# Patient Record
Sex: Male | Born: 1995 | Race: Black or African American | Hispanic: No | Marital: Single | Smoking: Never smoker
Health system: Southern US, Community
[De-identification: ages and names within clinical notes are randomized; demographics above are authoritative.]

## PROBLEM LIST (undated history)

## (undated) HISTORY — PX: APPENDECTOMY: SHX54

---

## 2020-03-23 ENCOUNTER — Emergency Department: Payer: Self-pay

## 2020-03-23 ENCOUNTER — Emergency Department
Admission: EM | Admit: 2020-03-23 | Discharge: 2020-03-23 | Disposition: A | Discharge status: Home / Self Care | Payer: Self-pay | Attending: Emergency Medicine | Admitting: Emergency Medicine

## 2020-03-23 ENCOUNTER — Other Ambulatory Visit: Payer: Self-pay

## 2020-03-23 DIAGNOSIS — Y999 Unspecified external cause status: Secondary | ICD-10-CM | POA: Insufficient documentation

## 2020-03-23 DIAGNOSIS — Y9289 Other specified places as the place of occurrence of the external cause: Secondary | ICD-10-CM | POA: Insufficient documentation

## 2020-03-23 DIAGNOSIS — Y9345 Activity, cheerleading: Secondary | ICD-10-CM | POA: Insufficient documentation

## 2020-03-23 DIAGNOSIS — W098XXA Fall on or from other playground equipment, initial encounter: Secondary | ICD-10-CM | POA: Insufficient documentation

## 2020-03-23 DIAGNOSIS — S83095A Other dislocation of left patella, initial encounter: Secondary | ICD-10-CM | POA: Insufficient documentation

## 2020-03-23 DIAGNOSIS — M7989 Other specified soft tissue disorders: Secondary | ICD-10-CM

## 2020-03-23 DIAGNOSIS — S83005A Unspecified dislocation of left patella, initial encounter: Secondary | ICD-10-CM

## 2020-03-23 MED ORDER — MORPHINE SULFATE (PF) 4 MG/ML IV SOLN
4.0000 mg | Freq: Once | INTRAVENOUS | Status: AC
  Administered 2020-03-23: 4 mg via INTRAVENOUS
  Filled 2020-03-23: qty 1

## 2020-03-23 MED ORDER — ONDANSETRON HCL 4 MG/2ML IJ SOLN
4.0000 mg | Freq: Once | INTRAMUSCULAR | Status: AC
  Administered 2020-03-23: 4 mg via INTRAVENOUS
  Filled 2020-03-23: qty 2

## 2020-03-23 MED ORDER — HYDROMORPHONE HCL 1 MG/ML IJ SOLN
1.0000 mg | Freq: Once | INTRAMUSCULAR | Status: AC
  Administered 2020-03-23: 1 mg via INTRAVENOUS
  Filled 2020-03-23: qty 1

## 2020-03-23 NOTE — ED Provider Notes (Signed)
[facility] Emergency Department Provider Note ____________________________________________   First MD Initiated Contact with Patient 03/23/20 1745     (approximate)  I have reviewed the triage vital signs and the nursing notes.   HISTORY  Chief Complaint Left Knee Dislocation    HPI Kyle Carey is a 24 y.o. male with PMH as noted below who presents with left knee injury, acute onset when the patient was tumbling during a cheerleading maneuver.  The patient had acute pain and was unable to walk.  He reports a deformity to the left knee.  He denies any prior history of knee patellar dislocations.  He denies any other injuries.  History reviewed. No pertinent past medical history.  There are no problems to display for this patient.   Past Surgical History:  Procedure Laterality Date  . APPENDECTOMY      Prior to Admission medications   Not on File    Allergies Contrast media [iodinated diagnostic agents]  No family history on file.  Social History Social History   Tobacco Use  . Smoking status: Never Smoker  . Smokeless tobacco: Never Used  Vaping Use  . Vaping Use: Never used  Substance Use Topics  . Alcohol use: Yes    Comment: Occasional / Social drinker  . Drug use: Yes    Types: Marijuana    Comment: Once a week    Review of Systems  Constitutional: No fever. Eyes: No redness. ENT: No sore throat. Cardiovascular: Denies chest pain. Respiratory: Denies shortness of breath. Gastrointestinal: No vomiting. Genitourinary: Negative for flank pain. Musculoskeletal: Negative for back pain.  Positive for left knee injury. Skin: Negative for rash. Neurological: Negative for focal weakness or numbness.   ____________________________________________   PHYSICAL EXAM:  VITAL SIGNS: ED Triage Vitals  Enc Vitals Group     BP 03/23/20 1739 127/74     Pulse Rate 03/23/20 1800 80     Resp 03/23/20 1800 20     Temp 03/23/20  1800 98.1 F (36.7 C)     Temp Source 03/23/20 1800 Oral     SpO2 03/23/20 1800 98 %     Weight 03/23/20 1751 233 lb 0.4 oz (105.7 kg)     Height 03/23/20 1751 5\' 10"  (1.778 m)     Head Circumference --      Peak Flow --      Pain Score 03/23/20 1751 7     Pain Loc --      Pain Edu? --      Excl. in GC? --     Constitutional: Alert and oriented.  Uncomfortable appearing but in no acute distress. Eyes: Conjunctivae are normal.  Head: Atraumatic. Nose: No congestion/rhinnorhea. Mouth/Throat: Mucous membranes are moist.   Neck: Normal range of motion.  Cardiovascular: Normal rate, regular rhythm.   Good peripheral circulation. Respiratory: Normal respiratory effort.  No retractions.  Gastrointestinal: No distention.  Musculoskeletal: No lower extremity edema.  Extremities warm and well perfused.  Left knee deformity consistent with patellar dislocation.  2+ DP pulse.  Full range of motion at the ankle and foot. Neurologic:  Normal speech and language.  5/5 motor strength and intact sensation to the distal left leg. Skin:  Skin is warm and dry. No rash noted. Psychiatric: Mood and affect are normal. Speech and behavior are normal.  ____________________________________________   LABS (all labs ordered are listed, but only abnormal results are displayed)  Labs Reviewed - No data to display ____________________________________________  EKG  ____________________________________________  RADIOLOGY  XR L knee: Lateral patellar dislocation XR L knee post reduction: Normal anatomic alignment of the patella ____________________________________________   PROCEDURES  Procedure(s) performed: Yes  .Ortho Injury Treatment  Date/Time: 03/23/2020 8:24 PM Performed by: Dionne Bucy, MD Authorized by: Dionne Bucy, MD   Consent:    Consent obtained:  Verbal   Consent given by:  Patient   Risks discussed:  Recurrent dislocation and stiffnessInjury location:  knee Location details: left knee Injury type: dislocation Dislocation type: lateral patellar Pre-procedure neurovascular assessment: neurovascularly intact  Anesthesia: Local anesthesia used: no  Patient sedated: NoManipulation performed: yes Reduction method: direct traction Reduction successful: yes X-ray confirmed reduction: yes Immobilization: brace Post-procedure neurovascular assessment: post-procedure neurovascularly intact Patient tolerance: patient tolerated the procedure well with no immediate complications     Critical Care performed: No ____________________________________________   INITIAL IMPRESSION / ASSESSMENT AND PLAN / ED COURSE  Pertinent labs & imaging results that were available during my care of the patient were reviewed by me and considered in my medical decision making (see chart for details).  24 year old male with no active medical problems presents with a left knee injury consistent with a patellar dislocation.  The left lower extremity is neuro/vascular intact on exam.  He has no other injuries.  He has no prior history of patellar dislocation.  We will give analgesia, obtain an x-ray to confirm, and then perform a reduction.  ----------------------------------------- 8:26 PM on 03/23/2020 -----------------------------------------  X-ray confirmed a lateral patellar dislocation.  The patient had good pain control with Dilaudid, and I was able to reduce the dislocation manually without sedation.  He tolerated the procedure well.  Postreduction x-ray confirmed good anatomic alignment.  He was placed in a knee immobilizer.  He is stable for discharge home at this time.  I instructed him on use of the knee immobilizer, orthopedic follow-up, and return precautions; he expressed understanding.  ____________________________________________   FINAL CLINICAL IMPRESSION(S) / ED DIAGNOSES  Final diagnoses:  Dislocation of left patella, initial encounter       NEW MEDICATIONS STARTED DURING THIS VISIT:  New Prescriptions   No medications on file     Note:  This document was prepared using Dragon voice recognition software and may include unintentional dictation errors.    Dionne Bucy, MD 03/23/20 2026

## 2020-03-23 NOTE — Discharge Instructions (Addendum)
Keep the knee immobilizer (the large brace) on for the next several days until the swelling and pain go down and the knee is feeling back to normal.  Follow-up with the orthopedist within the next 1 to 2 weeks.  Do not resume strenuous physical activity until you are seen by the orthopedist.  Return to the ER for new, worsening, or persistent severe pain, recurrent dislocation, weakness or numbness, or any other new or worsening symptoms that concern you.

## 2020-03-23 NOTE — ED Triage Notes (Signed)
Patient arrived via EMS. Patient is from home, AOx[facility] and ambulatory at baseline although not currently. Patient was at cheer practice and dislocated left knee. Patient was given fentanyl in route by EMS.   Patient has no other injuries or complaints at this time.

## 2022-02-28 IMAGING — DX DG KNEE 1-2V PORT*L*
2 series · 2 of 2 positions shown · non-contrast
Comparison: 03/23/2020 [DATE] p.m.

CLINICAL DATA: Patellar dislocation status post reduction

EXAM:
PORTABLE LEFT KNEE - 1-2 VIEW

[knee ap]
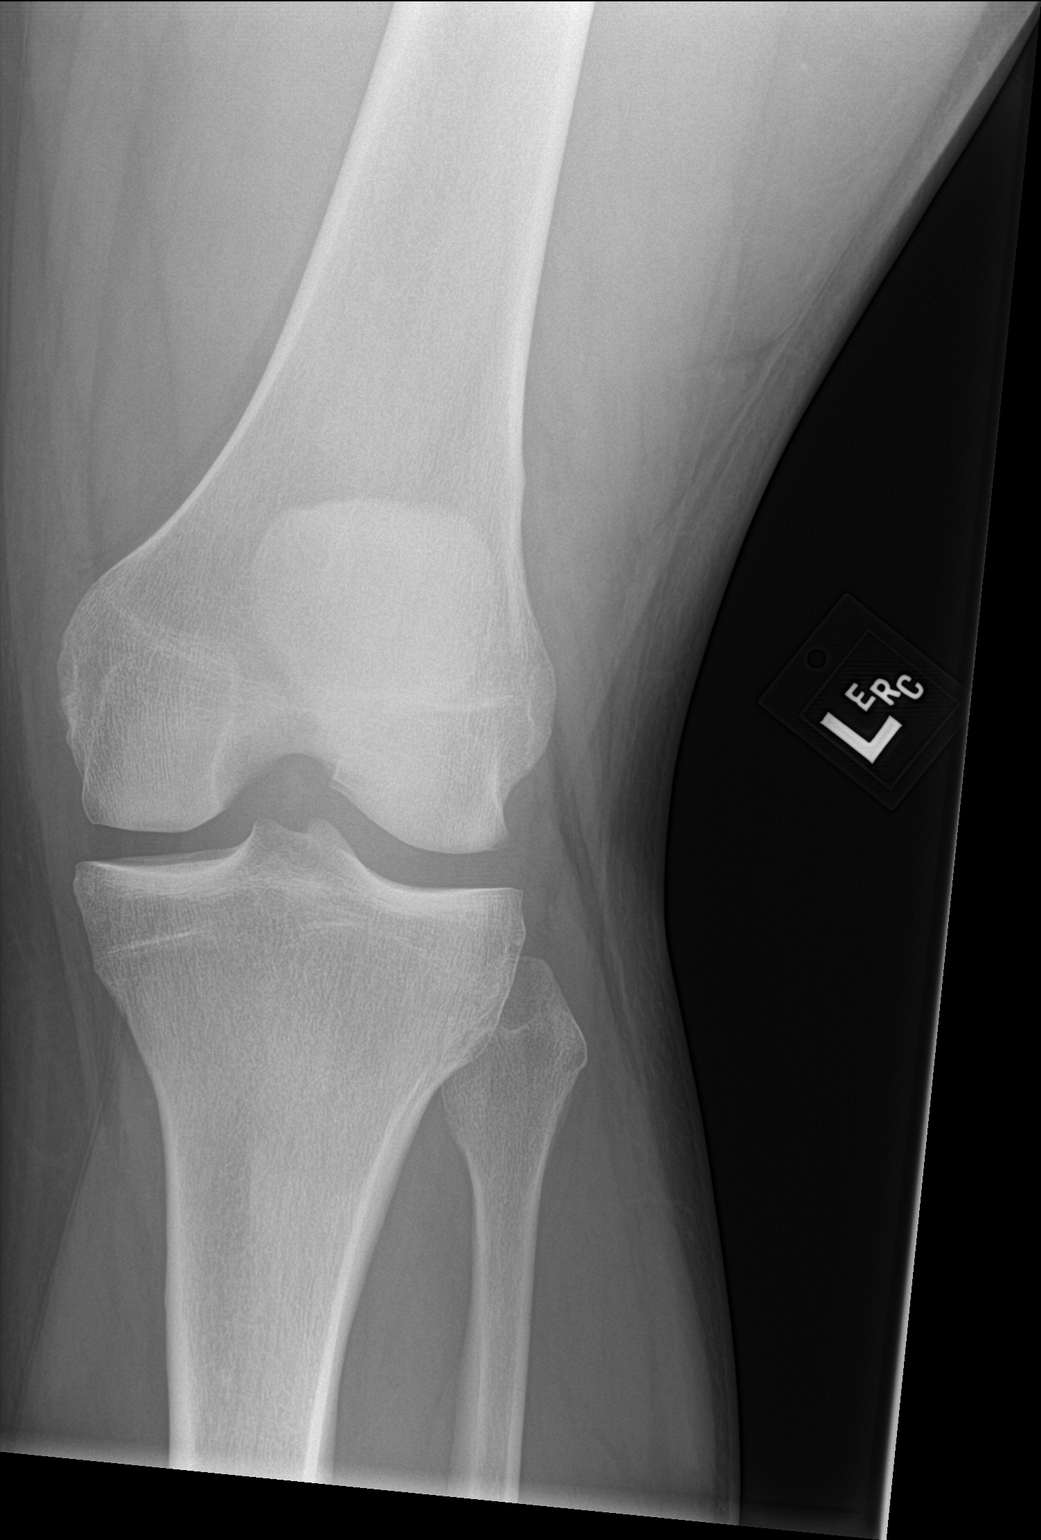

[knee lat]
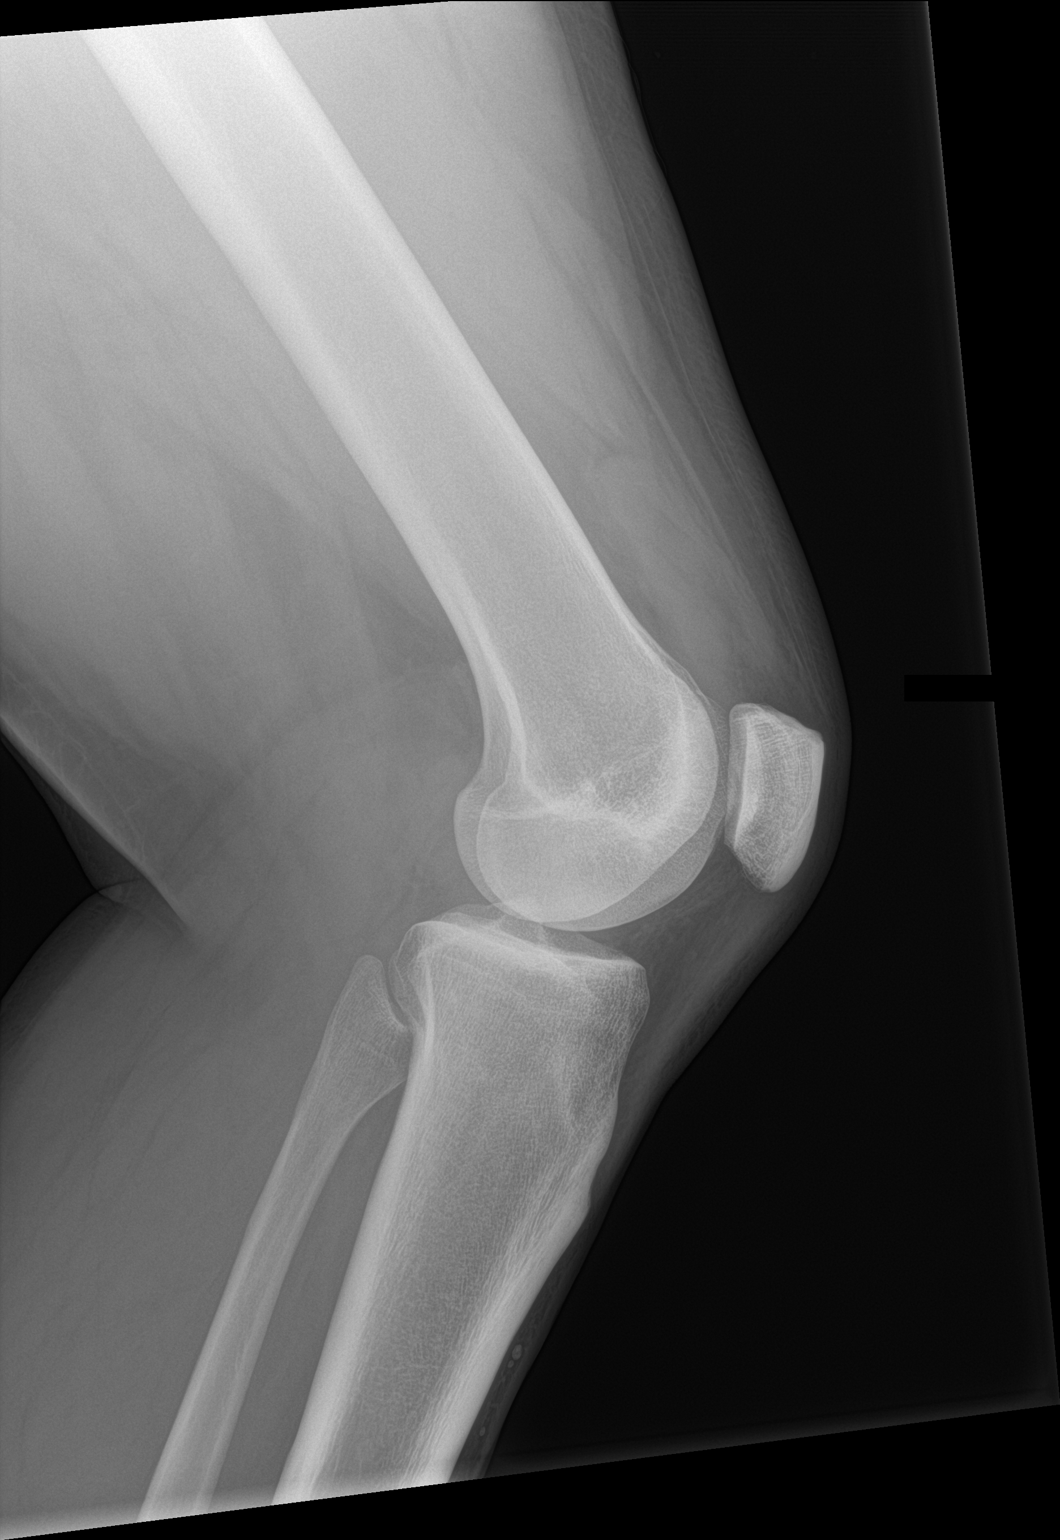

[2 of 2 positions shown; findings below may reference images not displayed]

FINDINGS: Frontal and lateral views of the left knee demonstrate reduction of
the prior lateral patellar dislocation, with anatomic alignment.
Joint spaces are well preserved. Moderate suprapatellar joint
effusion. No acute fractures.
IMPRESSION: 1. Anatomic alignment of the patellofemoral joint.
2. Moderate joint effusion.
